# Patient Record
Sex: Male | Born: 1955 | Race: White | Hispanic: No | Marital: Married | Smoking: Former smoker
Health system: Southern US, Community
[De-identification: ages and names within clinical notes are randomized; demographics above are authoritative.]

## PROBLEM LIST (undated history)

## (undated) DIAGNOSIS — K219 Gastro-esophageal reflux disease without esophagitis: Secondary | ICD-10-CM

## (undated) DIAGNOSIS — I10 Essential (primary) hypertension: Secondary | ICD-10-CM

## (undated) DIAGNOSIS — E785 Hyperlipidemia, unspecified: Secondary | ICD-10-CM

## (undated) DIAGNOSIS — E119 Type 2 diabetes mellitus without complications: Secondary | ICD-10-CM

## (undated) DIAGNOSIS — E669 Obesity, unspecified: Secondary | ICD-10-CM

## (undated) DIAGNOSIS — C9500 Acute leukemia of unspecified cell type not having achieved remission: Secondary | ICD-10-CM

## (undated) HISTORY — PX: ADENOIDECTOMY: SHX5191

## (undated) HISTORY — DX: Hyperlipidemia, unspecified: E78.5

## (undated) HISTORY — PX: CORONARY ANGIOPLASTY WITH STENT PLACEMENT: SHX49

## (undated) HISTORY — DX: Obesity, unspecified: E66.9

---

## 2022-01-04 ENCOUNTER — Encounter (HOSPITAL_BASED_OUTPATIENT_CLINIC_OR_DEPARTMENT_OTHER): Payer: Self-pay | Admitting: Emergency Medicine

## 2022-01-04 ENCOUNTER — Emergency Department (HOSPITAL_BASED_OUTPATIENT_CLINIC_OR_DEPARTMENT_OTHER)
Admission: EM | Admit: 2022-01-04 | Discharge: 2022-01-04 | Disposition: A | Discharge status: Home / Self Care | Payer: 59 | Attending: Emergency Medicine | Admitting: Emergency Medicine

## 2022-01-04 ENCOUNTER — Emergency Department (HOSPITAL_BASED_OUTPATIENT_CLINIC_OR_DEPARTMENT_OTHER): Payer: 59

## 2022-01-04 ENCOUNTER — Other Ambulatory Visit: Payer: Self-pay

## 2022-01-04 DIAGNOSIS — R079 Chest pain, unspecified: Secondary | ICD-10-CM | POA: Diagnosis present

## 2022-01-04 DIAGNOSIS — I498 Other specified cardiac arrhythmias: Secondary | ICD-10-CM

## 2022-01-04 DIAGNOSIS — E119 Type 2 diabetes mellitus without complications: Secondary | ICD-10-CM | POA: Diagnosis not present

## 2022-01-04 DIAGNOSIS — I10 Essential (primary) hypertension: Secondary | ICD-10-CM | POA: Diagnosis not present

## 2022-01-04 DIAGNOSIS — R008 Other abnormalities of heart beat: Secondary | ICD-10-CM | POA: Diagnosis not present

## 2022-01-04 HISTORY — DX: Acute leukemia of unspecified cell type not having achieved remission: C95.00

## 2022-01-04 HISTORY — DX: Gastro-esophageal reflux disease without esophagitis: K21.9

## 2022-01-04 HISTORY — DX: Type 2 diabetes mellitus without complications: E11.9

## 2022-01-04 HISTORY — DX: Essential (primary) hypertension: I10

## 2022-01-04 LAB — BASIC METABOLIC PANEL
Anion gap: 6 (ref 5–15)
BUN: 23 mg/dL (ref 8–23)
CO2: 27 mmol/L (ref 22–32)
Calcium: 9.2 mg/dL (ref 8.9–10.3)
Chloride: 105 mmol/L (ref 98–111)
Creatinine, Ser: 1.54 mg/dL — ABNORMAL HIGH (ref 0.61–1.24)
GFR, Estimated: 49 mL/min — ABNORMAL LOW (ref >60)
Glucose, Bld: 164 mg/dL — ABNORMAL HIGH (ref 70–99)
Potassium: 3.9 mmol/L (ref 3.5–5.1)
Sodium: 138 mmol/L (ref 135–145)

## 2022-01-04 LAB — CBC
HCT: 48.8 % (ref 39.0–52.0)
Hemoglobin: 16.7 g/dL (ref 13.0–17.0)
MCH: 31.8 pg (ref 26.0–34.0)
MCHC: 34.2 g/dL (ref 30.0–36.0)
MCV: 93 fL (ref 80.0–100.0)
Platelets: 146 10*3/uL — ABNORMAL LOW (ref 150–400)
RBC: 5.25 MIL/uL (ref 4.22–5.81)
RDW: 12.4 % (ref 11.5–15.5)
WBC: 7.2 10*3/uL (ref 4.0–10.5)
nRBC: 0 % (ref 0.0–0.2)

## 2022-01-04 LAB — TROPONIN I (HIGH SENSITIVITY): Troponin I (High Sensitivity): 4 ng/L (ref <18)

## 2022-01-04 NOTE — ED Triage Notes (Signed)
Chest pain with walking x 3 months. Denies any SOB. denies any sob, denies any pain when sitting. Pt states he is going to [place] in a few weeks and wants to be evaluated. PCP and cardiologist advised he come to ER.  ?

## 2022-01-04 NOTE — Discharge Instructions (Addendum)
Please return to emergency department immediately for any worsening concerning signs or symptoms. ? ?Please get stress test and follow-up with the provided cardiologist for chest pain and bigeminy found during today's visit. ? ? ?

## 2022-01-04 NOTE — ED Provider Notes (Signed)
?[facility] ?Provider Note ? ? ?CSN: [id number] ?Arrival date & time: 01/04/22  1521 ? ?  ? ?History ? ?Chief Complaint  ?Patient presents with  ? Chest Pain  ? ? ?Koven Belinsky is a 66 y.o. male. ? ?Patient is a 66 year old male with past medical history of hypertension, diabetes, and previous coronary angiography presenting for complaints of chest pain.  Patient admits to intermittent chest pain, only occurring with exertion-specifically when walking uphill, described as pressure-like, resolving at rest, x3 months.  Denies any shortness of breath, lower extremity swelling, orthopnea, fevers, cough, or any other related symptoms. ? ?Previous cardiologist Dr. Sherrian Divers, last seen in 2019 ?Requesting cardiologist Dr. Julianne Handler (wife's mother uses and adores) ? ? ?The history is provided by the patient. No language interpreter was used.  ?Chest Pain ?Associated symptoms: no abdominal pain, no back pain, no cough, no fever, no palpitations, no shortness of breath and no vomiting   ? ?HPI: A 66 year old patient with a history of treated diabetes and hypertension presents for evaluation of chest pain. Initial onset of pain was more than 6 hours ago. The patient's chest pain is described as heaviness/pressure/tightness and is not worse with exertion. The patient's chest pain is not middle- or left-sided, is not well-localized, is not sharp and does not radiate to the arms/jaw/neck. The patient does not complain of nausea and denies diaphoresis. The patient has no history of stroke, has no history of peripheral artery disease, has not smoked in the past 90 days, has no relevant family history of coronary artery disease (first degree relative at less than age 33), has no history of hypercholesterolemia and does not have an elevated BMI (>=30).  ? ?Home Medications ?Prior to Admission medications   ?Not on File  ?   ? ?Allergies    ?Iodine   ? ?Review of Systems   ?Review of Systems  ?Constitutional:   Negative for chills and fever.  ?HENT:  Negative for ear pain and sore throat.   ?Eyes:  Negative for pain and visual disturbance.  ?Respiratory:  Negative for cough and shortness of breath.   ?Cardiovascular:  Positive for chest pain. Negative for palpitations.  ?Gastrointestinal:  Negative for abdominal pain and vomiting.  ?Genitourinary:  Negative for dysuria and hematuria.  ?Musculoskeletal:  Negative for arthralgias and back pain.  ?Skin:  Negative for color change and rash.  ?Neurological:  Negative for seizures and syncope.  ?All other systems reviewed and are negative. ? ?Physical Exam ?Updated Vital Signs ?BP 117/60   Pulse (!) 38   Temp 98.4 ?F (36.9 ?C) (Oral)   Resp 19   Wt 115.7 kg   SpO2 95%  ?Physical Exam ?Vitals and nursing note reviewed.  ?Constitutional:   ?   General: He is not in acute distress. ?   Appearance: He is well-developed.  ?HENT:  ?   Head: Normocephalic and atraumatic.  ?Eyes:  ?   Conjunctiva/sclera: Conjunctivae normal.  ?Cardiovascular:  ?   Rate and Rhythm: Normal rate and regular rhythm.  ?   Heart sounds: No murmur heard. ?Pulmonary:  ?   Effort: Pulmonary effort is normal. No respiratory distress.  ?   Breath sounds: Normal breath sounds.  ?Abdominal:  ?   Palpations: Abdomen is soft.  ?   Tenderness: There is no abdominal tenderness.  ?Musculoskeletal:     ?   General: No swelling.  ?   Cervical back: Neck supple.  ?Skin: ?   General: Skin  is warm and dry.  ?   Capillary Refill: Capillary refill takes less than 2 seconds.  ?Neurological:  ?   Mental Status: He is alert.  ?Psychiatric:     ?   Mood and Affect: Mood normal.  ? ? ?ED Results / Procedures / Treatments   ?Labs ?(all labs ordered are listed, but only abnormal results are displayed) ?Labs Reviewed  ?CBC - Abnormal; Notable for the following components:  ?    Result Value  ? Platelets 146 (*)   ? All other components within normal limits  ?BASIC METABOLIC PANEL - Abnormal; Notable for the following components:   ? Glucose, Bld 164 (*)   ? Creatinine, Ser 1.54 (*)   ? GFR, Estimated 49 (*)   ? All other components within normal limits  ?TROPONIN I (HIGH SENSITIVITY)  ?TROPONIN I (HIGH SENSITIVITY)  ? ? ?EKG ?EKG Interpretation ? ?Date/Time:  Monday Jan 04 2022 15:31:11 EDT ?Ventricular Rate:  65 ?PR Interval:  164 ?QRS Duration: 96 ?QT Interval:  394 ?QTC Calculation: 409 ?R Axis:   26 ?Text Interpretation: Normal sinus rhythm Normal ECG No previous ECGs available Confirmed by Campbell Stall ([id number]) on 02/04/5915 3:52:32 PM ? ?Radiology ?DG Chest 2 View ? ?Result Date: 01/04/2022 ?CLINICAL DATA:  Chest pain EXAM: CHEST - 2 VIEW COMPARISON:  09/13/2013 FINDINGS: The heart size and mediastinal contours are within normal limits. Both lungs are clear. The visualized skeletal structures are unremarkable. IMPRESSION: No active cardiopulmonary disease. Electronically Signed   By: Davina Poke D.O.   On: 01/04/2022 15:55   ? ?Procedures ?Procedures  ? ? ?Medications Ordered in ED ?Medications - No data to display ? ?ED Course/ Medical Decision Making/ A&P ?  ?HEAR Score: 5                       ?Medical Decision Making ?Amount and/or Complexity of Data Reviewed ?Labs: ordered. ?Radiology: ordered. ? ? ?17:61 PM ?66 year old male with past medical history of hypertension, diabetes, and previous coronary angiography 2017 presenting for complaints of chest pain.  ? ?The patient's chest pain is not suggestive of pulmonary embolus, cardiac ischemia, aortic dissection, pericarditis, myocarditis, pulmonary embolism, pneumothorax, pneumonia, Zoster, or esophageal perforation, or other serious etiology.  Historically not abrupt in onset, tearing or ripping, pulses symmetric. EKG nonspecific for ischemia/infarction. No dysrhythmias, brugada, WPW, prolonged QT noted. Troponin negative x2. CXR reviewed and stable. Labs without demonstration of acute pathology unless otherwise noted above.  ? ?Low HEART Score: 5.  At this time patient describes  chest pain that has happened intermittently with walking up a hill only.  No chest pain at rest.  No active chest pain.  At this time I think it is fair to control outpatient stress test for patient with most cardiology follow-up.  Patient agreeable to plan. ? ? Patient in no distress and overall condition improved here in the ED. Detailed discussions were had with the patient regarding current findings, and need for close f/u with PCP or on call doctor. The patient has been instructed to return immediately if the symptoms worsen in any way for re-evaluation. Patient verbalized understanding and is in agreement with current care plan. All questions answered prior to discharge.  ? ? ? ? ? ? ? ?Final Clinical Impression(s) / ED Diagnoses ?Final diagnoses:  ?Bigeminy  ?Chest pain, unspecified type  ? ? ?Rx / DC Orders ?ED Discharge Orders   ? ?      Ordered  ?  MYOCARDIAL PERFUSION IMAGING       ? 01/04/22 1835  ? ?  ?  ? ?  ? ? ?  ?Lianne Cure, DO ?[birth year] 1841 ? ?

## 2022-02-17 ENCOUNTER — Telehealth: Payer: Self-pay | Admitting: Cardiovascular Disease

## 2022-02-17 NOTE — Telephone Encounter (Signed)
Pt called to inform Dr. Angelena Form before his first consultation with him that he was in [place] last week and had a stent repair and angioplasty there. He stated that he does have his medical records.

## 2022-02-17 NOTE — Telephone Encounter (Signed)
Pt will bring records to his appt for Dr. Angelena Form. States he wanted McAlhany because he knew him somewhat, MD used to take care of his mother-in-law Noberto Retort. He appreciates the call. Aware I will forward to MD for his FYI.

## 2022-02-24 ENCOUNTER — Encounter: Payer: Self-pay | Admitting: Cardiovascular Disease

## 2022-02-24 ENCOUNTER — Ambulatory Visit (INDEPENDENT_AMBULATORY_CARE_PROVIDER_SITE_OTHER): Payer: 59 | Admitting: Cardiovascular Disease

## 2022-02-24 VITALS — BP 142/70 | HR 65 | Ht 72.0 in | Wt 253.0 lb

## 2022-02-24 DIAGNOSIS — E7800 Pure hypercholesterolemia, unspecified: Secondary | ICD-10-CM

## 2022-02-24 DIAGNOSIS — I2510 Atherosclerotic heart disease of native coronary artery without angina pectoris: Secondary | ICD-10-CM

## 2022-02-24 DIAGNOSIS — I10 Essential (primary) hypertension: Secondary | ICD-10-CM | POA: Diagnosis not present

## 2022-02-24 MED ORDER — CLOPIDOGREL BISULFATE 75 MG PO TABS: 75.0000 mg | ORAL_TABLET | Freq: Every day | ORAL | 3 refills | Status: DC

## 2022-02-24 NOTE — Patient Instructions (Signed)
Medication Instructions:  Your physician recommends that you continue on your current medications as directed. Please refer to the Current Medication list given to you today.  *If you need a refill on your cardiac medications before your next appointment, please call your pharmacy*   Testing/Procedures: Your physician has requested that you have an echocardiogram. Echocardiography is a painless test that uses sound waves to create images of your heart. It provides your doctor with information about the size and shape of your heart and how well your heart's chambers and valves are working. This procedure takes approximately one hour. There are no restrictions for this procedure.    Follow-Up: At [facility], you and your health needs are our priority.  As part of our continuing mission to provide you with exceptional heart care, we have created designated Provider Care Teams.  These Care Teams include your primary Cardiologist (physician) and Advanced Practice Providers (APPs -  Physician Assistants and Nurse Practitioners) who all work together to provide you with the care you need, when you need it.   Your next appointment:   6 month(s)  The format for your next appointment:   In Person  Provider:   Dr. Angelena Form     Important Information About Sugar

## 2022-02-24 NOTE — Progress Notes (Signed)
Chief Complaint  Patient presents with   New Patient (Initial Visit)    CAD   History of Present Illness:66 yo male with history of DM, HLD, HTN, obesity, leukemia ([place]), GERD and CAD who is here today as a new patient. He had a Resolute drug eluting stent placed in his Obtuse marginal branch in 2015 in [place]. He had an MI in April 2023 and had stent thrombosis of the OM stent treated with cutting balloon angioplasty and repeat stenting with a Xience DES. Also noted to have moderate proximal and mid LAD stenosis. No record of his LVEF. He has been feeling well. No chest pain, dyspnea, LE edema.   Primary Care Physician: [facility]   Past Medical History:  Diagnosis Date   Diabetes mellitus without complication ([facility])    GERD (gastroesophageal reflux disease)    Hyperlipidemia    Hypertension    Leukemia, acute ([place])    Obesity     Past Surgical History:  Procedure Laterality Date   ADENOIDECTOMY     CORONARY ANGIOPLASTY WITH STENT PLACEMENT      Current Outpatient Medications  Medication Sig Dispense Refill   aspirin EC 81 MG tablet Take 81 mg by mouth daily.     atenolol (TENORMIN) 50 MG tablet Take 50 mg by mouth daily.     dapagliflozin propanediol (FARXIGA) 10 MG TABS tablet Take 1 tablet by mouth daily.     glipiZIDE (GLUCOTROL XL) 5 MG 24 hr tablet Take 5 mg by mouth daily with breakfast.     lisinopril (ZESTRIL) 20 MG tablet Take 20 mg by mouth daily.     pantoprazole (PROTONIX) 40 MG tablet Take 40 mg by mouth daily.     pioglitazone-metformin (ACTOPLUS MET) 15-850 MG tablet Take 1 tablet by mouth in the morning and at bedtime.     pravastatin (PRAVACHOL) 40 MG tablet Take 40 mg by mouth daily.     clopidogrel (PLAVIX) 75 MG tablet Take 1 tablet (75 mg total) by mouth daily. 90 tablet 3   No current facility-administered medications for this visit.    Allergies  Allergen Reactions   Iodine Anaphylaxis   Ivp Dye [Iodinated Contrast Media]  Anaphylaxis    Social History   Socioeconomic History   Marital status: Married    Spouse name: Not on file   Number of children: Not on file   Years of education: Not on file   Highest education level: Not on file  Occupational History   Not on file  Tobacco Use   Smoking status: Former    Packs/day: 1.00    Years: 15.00    Total pack years: 15.00    Types: Cigarettes   Smokeless tobacco: Never  Substance and Sexual Activity   Alcohol use: Not Currently   Drug use: Not Currently   Sexual activity: Not on file  Other Topics Concern   Not on file  Social History Narrative   Not on file   Social Determinants of Health   Financial Resource Strain: Not on file  Food Insecurity: Not on file  Transportation Needs: Not on file  Physical Activity: Not on file  Stress: Not on file  Social Connections: Not on file  Intimate Partner Violence: Not on file    Family History  Problem Relation Age of Onset   Heart disease Mother    Heart attack Father     Review of Systems:  As stated in the HPI and otherwise  negative.   BP (!) 142/70   Pulse 65   Ht 6' (1.829 m)   Wt 253 lb (114.8 kg)   SpO2 98%   BMI 34.31 kg/m   Physical Examination: General: Well developed, well nourished, NAD  HEENT: OP clear, mucus membranes moist  SKIN: warm, dry. No rashes. Neuro: No focal deficits  Musculoskeletal: Muscle strength 5/5 all ext  Psychiatric: Mood and affect normal  Neck: No JVD, no carotid bruits, no thyromegaly, no lymphadenopathy.  Lungs:Clear bilaterally, no wheezes, rhonci, crackles Cardiovascular: Regular rate and rhythm. No murmurs, gallops or rubs. Abdomen:Soft. Bowel sounds present. Non-tender.  Extremities: No lower extremity edema. Pulses are 2 + in the bilateral DP/PT.  EKG:  EKG is ordered today. The ekg ordered today demonstrates Sinus, PVC  Recent Labs: 01/04/2022: BUN 23; Creatinine, Ser 1.54; Hemoglobin 16.7; Platelets 146; Potassium 3.9; Sodium 138    Lipid Panel No results found for: "CHOL", "TRIG", "HDL", "CHOLHDL", "VLDL", "LDLCALC", "LDLDIRECT"   Wt Readings from Last 3 Encounters:  02/24/22 253 lb (114.8 kg)  01/04/22 255 lb (115.7 kg)     Assessment and Plan:   1. CAD without angina: No chest pain. Recent MI in [place] with placement of a DES in the OM branch. Moderate LAD stenosis treated medically. Will continue DAPT with ASA and Plavix for at least a year. Continue beta blocker and statin. Echo now to assess LVEF.   2. HTN: BP controlled. No changes.   3. Hyperlipidemia: Continue statin  Labs/ tests ordered today include:   Orders Placed This Encounter  Procedures   EKG 12-Lead   ECHOCARDIOGRAM COMPLETE   Disposition:   F/U with me in 6 months.    Signed, Lauree Chandler, MD 02/24/2022 9:48 AM    [facility] [address], [place], [place]  [address] Phone: [phone]; Fax: [phone]

## 2022-03-15 ENCOUNTER — Ambulatory Visit (HOSPITAL_COMMUNITY): Payer: 59 | Attending: Internal Medicine

## 2022-03-15 DIAGNOSIS — I10 Essential (primary) hypertension: Secondary | ICD-10-CM | POA: Diagnosis not present

## 2022-03-15 DIAGNOSIS — E7800 Pure hypercholesterolemia, unspecified: Secondary | ICD-10-CM | POA: Diagnosis not present

## 2022-03-15 DIAGNOSIS — I2510 Atherosclerotic heart disease of native coronary artery without angina pectoris: Secondary | ICD-10-CM | POA: Insufficient documentation

## 2022-03-15 LAB — ECHOCARDIOGRAM COMPLETE
Area-P 1/2: 3.6 cm2
S' Lateral: 3.4 cm

## 2022-05-10 ENCOUNTER — Encounter: Payer: Self-pay | Admitting: Nurse Practitioner

## 2022-05-10 ENCOUNTER — Ambulatory Visit: Payer: 59 | Attending: Nurse Practitioner | Admitting: Nurse Practitioner

## 2022-05-10 ENCOUNTER — Telehealth: Payer: Self-pay | Admitting: Cardiovascular Disease

## 2022-05-10 ENCOUNTER — Ambulatory Visit (INDEPENDENT_AMBULATORY_CARE_PROVIDER_SITE_OTHER): Payer: 59

## 2022-05-10 VITALS — BP 128/84 | HR 56 | Ht 72.0 in | Wt 256.0 lb

## 2022-05-10 DIAGNOSIS — I10 Essential (primary) hypertension: Secondary | ICD-10-CM | POA: Diagnosis not present

## 2022-05-10 DIAGNOSIS — I498 Other specified cardiac arrhythmias: Secondary | ICD-10-CM

## 2022-05-10 DIAGNOSIS — R001 Bradycardia, unspecified: Secondary | ICD-10-CM

## 2022-05-10 DIAGNOSIS — R002 Palpitations: Secondary | ICD-10-CM

## 2022-05-10 DIAGNOSIS — I2510 Atherosclerotic heart disease of native coronary artery without angina pectoris: Secondary | ICD-10-CM | POA: Diagnosis not present

## 2022-05-10 DIAGNOSIS — E785 Hyperlipidemia, unspecified: Secondary | ICD-10-CM

## 2022-05-10 NOTE — Progress Notes (Unsigned)
Enrolled for [employer] to mail a ZIO XT long term holter monitor to the patients address on file.   Dr. McAlhany to read. 

## 2022-05-10 NOTE — Telephone Encounter (Signed)
Call sent straight to triage. Patient complaining of low heart rate and palpitations. Patent stated he went to his oncologist last Friday and they were concerned about his low HR and did an EKG that was showing some possible bigeminy. Patient's oncologist advised that patient sees cardiology. Made patient an appointment with Christen Bame PA to get evaluated. Patient will bring notes and EKG from oncologist office with him.

## 2022-05-10 NOTE — Patient Instructions (Signed)
Medication Instructions:   Your physician recommends that you continue on your current medications as directed. Please refer to the Current Medication list given to you today.   *If you need a refill on your cardiac medications before your next appointment, please call your pharmacy*   Lab Work:  None ordered.  If you have labs (blood work) drawn today and your tests are completely normal, you will receive your results only by: [place] (if you have MyChart) OR A paper copy in the mail If you have any lab test that is abnormal or we need to change your treatment, we will call you to review the results.   Testing/Procedures:  [facility] Gulling- Long Term Monitor Instructions  Your physician has requested you wear a ZIO patch monitor for 14 days.  This is a single patch monitor. [employer] supplies one patch monitor per enrollment. Additional stickers are not available. Please do not apply patch if you will be having a Nuclear Stress Test,  Echocardiogram, Cardiac CT, MRI, or Chest Xray during the period you would be wearing the  monitor. The patch cannot be worn during these tests. You cannot remove and re-apply the  ZIO XT patch monitor.  Your ZIO patch monitor will be mailed 3 day USPS to your address on file. It may take 3-5 days  to receive your monitor after you have been enrolled.  Once you have received your monitor, please review the enclosed instructions. Your monitor  has already been registered assigning a specific monitor serial # to you.  Billing and Patient Assistance Program Information  We have supplied [employer] with any of your insurance information on file for billing purposes. [employer] offers a sliding scale Patient Assistance Program for patients that do not have  insurance, or whose insurance does not completely cover the cost of the ZIO monitor.  You must apply for the Patient Assistance Program to qualify for this discounted rate.  To apply, please call [employer] at  [phone], select option 4, select option 2, ask to apply for  Patient Assistance Program. Theodore Demark will ask your household income, and how many people  are in your household. They will quote your out-of-pocket cost based on that information.  [employer] will also be able to set up a 62-month, interest-free payment plan if needed.  Applying the monitor   Shave hair from upper left chest.  Hold abrader disc by orange tab. Rub abrader in 40 strokes over the upper left chest as  indicated in your monitor instructions.  Clean area with 4 enclosed alcohol pads. Let dry.  Apply patch as indicated in monitor instructions. Patch will be placed under collarbone on left  side of chest with arrow pointing upward.  Rub patch adhesive wings for 2 minutes. Remove white label marked "1". Remove the white  label marked "2". Rub patch adhesive wings for 2 additional minutes.  While looking in a mirror, press and release button in center of patch. A small green light will  flash 3-4 times. This will be your only indicator that the monitor has been turned on.  Do not shower for the first 24 hours. You may shower after the first 24 hours.  Press the button if you feel a symptom. You will hear a small click. Record Date, Time and  Symptom in the Patient Logbook.  When you are ready to remove the patch, follow instructions on the last 2 pages of Patient  Logbook. Stick patch monitor onto the last page of Patient  Logbook.  Place Patient Logbook in the blue and white box. Use locking tab on box and tape box closed  securely. The blue and white box has prepaid postage on it. Please place it in the mailbox as  soon as possible. Your physician should have your test results approximately 7 days after the  monitor has been mailed back to [facility].  Call [place] at [phone] if you have questions regarding  your ZIO XT patch monitor. Call them immediately if you see an orange light  blinking on your  monitor.  If your monitor falls off in less than 4 days, contact our Monitor department at [phone].  If your monitor becomes loose or falls off after 4 days call [employer] at [phone] for  suggestions on securing your monitor    Follow-Up: At [facility], you and your health needs are our priority.  As part of our continuing mission to provide you with exceptional heart care, we have created designated Provider Care Teams.  These Care Teams include your primary Cardiologist (physician) and Advanced Practice Providers (APPs -  Physician Assistants and Nurse Practitioners) who all work together to provide you with the care you need, when you need it.  We recommend signing up for the patient portal called "MyChart".  Sign up information is provided on this After Visit Summary.  MyChart is used to connect with patients for Virtual Visits (Telemedicine).  Patients are able to view lab/test results, encounter notes, upcoming appointments, etc.  Non-urgent messages can be sent to your provider as well.   To learn more about what you can do with MyChart, go to [email or web address].    Your next appointment:   3 month(s)  The format for your next appointment:   In Person  Provider:   Lauree Chandler, MD      Important Information About Sugar

## 2022-05-10 NOTE — Progress Notes (Signed)
Cardiology Office Note:    Date:  05/10/2022   ID:  Steven Cannon, DOB [birth year], MRN [id number]  PCP:  [facility], [place] Providers Cardiologist:  Lauree Chandler, MD      Referring MD: [facility], [place] [facility]*   Chief Complaint: bradycardia   History of Present Illness:    Steven Cannon is a very pleasant 66 y.o. male with a hx of acute promyelocytic leukemia, HTN, HLD, CAD with a history of DES in obtuse marginal branch in 2015 in [place]. Had MI April 2023 with stent thrombosis of OM stent treated with Cutting Balloon angioplasty and repeat stenting with DES.  Noted to have moderate proximal and mid LAD stenosis. LVEF not mentioned.  He established care with Steven Cannon on 02/24/2022 at which time he reported no chest pain. On DAPT with aspirin and Plavix. Outside cath films were reviewed by Steven Cannon.  Echocardiogram was ordered which revealed normal LVEF, trivial MR.   Today, he is here for evaluation of low heart rate. He contacted our office this morning with concerns of HR in the 30s at rest and palpitations. Reports he is noting HR as low as 30's bpm when resting. Walked at moderate to high intensity with 4 inclines for 3/4 mile over the weekend and HR increased to 60s bpm. He denies chest pain, shortness of breath, lower extremity edema, fatigue, palpitations, melena, hematuria, hemoptysis, diaphoresis, weakness, presyncope, syncope, orthopnea, and PND. Was told he had trigeminy during hospitalization in [place] prior to cardiac catheterization. Having burning in chest that occurs after drinking seltzers. Switched from pantoprazole to Nexium. Has appointment with PCP in 4 days to address.   Past Medical History:  Diagnosis Date   Diabetes mellitus without complication (HCC)    GERD (gastroesophageal reflux disease)    Hyperlipidemia    Hypertension    Leukemia, acute ([place])    Obesity     Past Surgical History:  Procedure  Laterality Date   ADENOIDECTOMY     CORONARY ANGIOPLASTY WITH STENT PLACEMENT      Current Medications: Current Meds  Medication Sig   aspirin EC 81 MG tablet Take 81 mg by mouth daily.   atenolol (TENORMIN) 50 MG tablet Take 50 mg by mouth daily.   clopidogrel (PLAVIX) 75 MG tablet Take 1 tablet (75 mg total) by mouth daily.   dapagliflozin propanediol (FARXIGA) 10 MG TABS tablet Take 1 tablet by mouth daily.   esomeprazole (NEXIUM) 20 MG capsule Take 20 mg by mouth daily.   glipiZIDE (GLUCOTROL XL) 5 MG 24 hr tablet Take 5 mg by mouth daily with breakfast.   lisinopril (ZESTRIL) 20 MG tablet Take 20 mg by mouth daily.   pioglitazone-metformin (ACTOPLUS MET) 15-850 MG tablet Take 1 tablet by mouth in the morning and at bedtime.   pravastatin (PRAVACHOL) 40 MG tablet Take 40 mg by mouth daily.     Allergies:   Iodine and Ivp dye [iodinated contrast media]   Social History   Socioeconomic History   Marital status: Married    Spouse name: Not on file   Number of children: Not on file   Years of education: Not on file   Highest education level: Not on file  Occupational History   Not on file  Tobacco Use   Smoking status: Former    Packs/day: 1.00    Years: 15.00    Total pack years: 15.00    Types: Cigarettes   Smokeless  tobacco: Never  Substance and Sexual Activity   Alcohol use: Not Currently   Drug use: Not Currently   Sexual activity: Not on file  Other Topics Concern   Not on file  Social History Narrative   Not on file   Social Determinants of Health   Financial Resource Strain: Not on file  Food Insecurity: Not on file  Transportation Needs: Not on file  Physical Activity: Not on file  Stress: Not on file  Social Connections: Not on file     Family History: The patient's family history includes Heart attack in his father; Heart disease in his mother.  ROS:   Please see the history of present illness.   + frequent PVCs All other systems reviewed and  are negative.  Labs/Other Studies Reviewed:    The following studies were reviewed today:  Echo 03/15/22   1. Left ventricular ejection fraction, by estimation, is 65 to 70%. The  left ventricle has normal function. The left ventricle has no regional  wall motion abnormalities. The left ventricular internal cavity size was  mildly dilated. Left ventricular  diastolic parameters were normal.   2. Right ventricular systolic function is normal. The right ventricular  size is normal.   3. Left atrial size was mildly dilated.   4. The mitral valve is normal in structure. Trivial mitral valve  regurgitation.   5. The aortic valve is normal in structure. Aortic valve regurgitation is  not visualized.   6. The inferior vena cava is normal in size with greater than 50%  respiratory variability, suggesting right atrial pressure of 3 mmHg.   Recent Labs: 01/04/2022: BUN 23; Creatinine, Ser 1.54; Hemoglobin 16.7; Platelets 146; Potassium 3.9; Sodium 138  Recent Lipid Panel No results found for: "CHOL", "TRIG", "HDL", "CHOLHDL", "VLDL", "LDLCALC", "LDLDIRECT"   Risk Assessment/Calculations:      Physical Exam:    VS:  BP 128/84   Pulse (!) 56   Ht 6' (1.829 m)   Wt 256 lb (116.1 kg)   BMI 34.72 kg/m     Wt Readings from Last 3 Encounters:  05/10/22 256 lb (116.1 kg)  02/24/22 253 lb (114.8 kg)  01/04/22 255 lb (115.7 kg)     GEN:  Well nourished, well developed in no acute distress HEENT: Normal NECK: No JVD; No carotid bruits CARDIAC: RRR, no murmurs, rubs, gallops RESPIRATORY:  Clear to auscultation without rales, wheezing or rhonchi  ABDOMEN: Soft, non-tender, obese MUSCULOSKELETAL:  No edema; No deformity. 2+ pedal pulses, equal bilaterally SKIN: Warm and dry NEUROLOGIC:  Alert and oriented x 3 PSYCHIATRIC:  Normal affect   EKG:  EKG is not ordered today.  He brought a copy of EKG completed [facility]/07/20[facility] at [facility] which reveals sinus rhythm at 66 bpm with  bigeminy   Diagnoses:    1. Coronary artery disease involving native coronary artery of native heart without angina pectoris   2. Bradycardia   3. Primary hypertension   4. Hyperlipidemia LDL goal <70   5. Palpitations   6. Ventricular bigeminy    Assessment and Plan:     Palpitations/Ventricular bigeminy: Recording low HR on home equipment. [facility] from [facility] on 9/8 reveals ventricular bigeminy. Suspect that home monitor is not picking up frequent PVCs.  We will place a 14-day Zio monitor for evaluation of arrhythmia. Has been on atenolol for many years. Would favor metoprolol in the place of atenolol but will await monitor results for dosing.  CAD without angina:  History of DES to obtuse marginal artery 2015 in [facility]. MI 11/2021 in [place] with cath that revealed ISR treated with balloon angioplasty and DES. He denies chest pain, dyspnea, or other symptoms concerning for angina. No indication for further ischemic evaluation at this time. No bleeding concerns. Continue atenolol, pravastatin, lisinopril, Farxiga, Plavix, and aspirin.   Hypertension: BP is well-controlled.   Hyperlipidemia LDL goal < 70: LDL 101 on 01/01/22. Not specifically addressed today. Would favor high intensity statin in the place of pravastatin.      Disposition: 3 months with Steven Cannon  Medication Adjustments/Labs and Tests Ordered: Current medicines are reviewed at length with the patient today.  Concerns regarding medicines are outlined above.  Orders Placed This Encounter  Procedures   LONG TERM MONITOR (3-14 DAYS)   No orders of the defined types were placed in this encounter.   Patient Instructions  Medication Instructions:   Your physician recommends that you continue on your current medications as directed. Please refer to the Current Medication list given to you today.   *If you need a refill on your cardiac medications before your next appointment, please call your pharmacy*   Lab  Work:  None ordered.  If you have labs (blood work) drawn today and your tests are completely normal, you will receive your results only by: [place] (if you have MyChart) OR A paper copy in the mail If you have any lab test that is abnormal or we need to change your treatment, we will call you to review the results.   Testing/Procedures:  [facility]- Long Term Monitor Instructions  Your physician has requested you wear a ZIO patch monitor for 14 days.  This is a single patch monitor. [employer] supplies one patch monitor per enrollment. Additional stickers are not available. Please do not apply patch if you will be having a Nuclear Stress Test,  Echocardiogram, Cardiac CT, MRI, or Chest Xray during the period you would be wearing the  monitor. The patch cannot be worn during these tests. You cannot remove and re-apply the  ZIO XT patch monitor.  Your ZIO patch monitor will be mailed 3 day USPS to your address on file. It may take 3-5 days  to receive your monitor after you have been enrolled.  Once you have received your monitor, please review the enclosed instructions. Your monitor  has already been registered assigning a specific monitor serial # to you.  Billing and Patient Assistance Program Information  We have supplied [employer] with any of your insurance information on file for billing purposes. [employer] offers a sliding scale Patient Assistance Program for patients that do not have  insurance, or whose insurance does not completely cover the cost of the ZIO monitor.  You must apply for the Patient Assistance Program to qualify for this discounted rate.  To apply, please call [employer] at [phone], select option 4, select option 2, ask to apply for  Patient Assistance Program. Theodore Demark will ask your household income, and how many people  are in your household. They will quote your out-of-pocket cost based on that information.  [employer] will also be able to set up a 70-month  interest-free payment plan if needed.  Applying the monitor   Shave hair from upper left chest.  Hold abrader disc by orange tab. Rub abrader in 40 strokes over the upper left chest as  indicated in your monitor instructions.  Clean area with 4 enclosed alcohol pads. Let dry.  Apply patch as  indicated in monitor instructions. Patch will be placed under collarbone on left  side of chest with arrow pointing upward.  Rub patch adhesive wings for 2 minutes. Remove white label marked "1". Remove the white  label marked "2". Rub patch adhesive wings for 2 additional minutes.  While looking in a mirror, press and release button in center of patch. A small green light will  flash 3-4 times. This will be your only indicator that the monitor has been turned on.  Do not shower for the first 24 hours. You may shower after the first 24 hours.  Press the button if you feel a symptom. You will hear a small click. Record Date, Time and  Symptom in the Patient Logbook.  When you are ready to remove the patch, follow instructions on the last 2 pages of Patient  Logbook. Stick patch monitor onto the last page of Patient Logbook.  Place Patient Logbook in the blue and white box. Use locking tab on box and tape box closed  securely. The blue and white box has prepaid postage on it. Please place it in the mailbox as  soon as possible. Your physician should have your test results approximately 7 days after the  monitor has been mailed back to [facility].  Call [place] at [phone] if you have questions regarding  your ZIO XT patch monitor. Call them immediately if you see an orange light blinking on your  monitor.  If your monitor falls off in less than 4 days, contact our Monitor department at [phone].  If your monitor becomes loose or falls off after 4 days call [employer] at [phone] for  suggestions on securing your monitor    Follow-Up: At [facility],  you and your health needs are our priority.  As part of our continuing mission to provide you with exceptional heart care, we have created designated Provider Care Teams.  These Care Teams include your primary Cardiologist (physician) and Advanced Practice Providers (APPs -  Physician Assistants and Nurse Practitioners) who all work together to provide you with the care you need, when you need it.  We recommend signing up for the patient portal called "MyChart".  Sign up information is provided on this After Visit Summary.  MyChart is used to connect with patients for Virtual Visits (Telemedicine).  Patients are able to view lab/test results, encounter notes, upcoming appointments, etc.  Non-urgent messages can be sent to your provider as well.   To learn more about what you can do with MyChart, go to [email or web address].    Your next appointment:   3 month(s)  The format for your next appointment:   In Person  Provider:   Lauree Chandler, MD      Important Information About Sugar         Signed, Emmaline Life, NP  05/10/2022 12:59 PM    [place]

## 2022-05-10 NOTE — Telephone Encounter (Signed)
STAT if HR is under 50 or over 120 (normal HR is 60-100 beats per minute)  What is your heart rate? 30  Do you have a log of your heart rate readings (document readings)? Yes   Do you have any other symptoms? Chest spasms, palpitations

## 2022-05-23 DIAGNOSIS — I10 Essential (primary) hypertension: Secondary | ICD-10-CM | POA: Diagnosis not present

## 2022-05-23 DIAGNOSIS — R001 Bradycardia, unspecified: Secondary | ICD-10-CM

## 2022-06-15 ENCOUNTER — Telehealth: Payer: Self-pay | Admitting: Nurse Practitioner

## 2022-06-15 DIAGNOSIS — I493 Ventricular premature depolarization: Secondary | ICD-10-CM

## 2022-06-15 NOTE — Telephone Encounter (Addendum)
Spoke w patient re: monitor results. Referral placed for EP for freq PVCs.  When I talked w him about changing atenolol 50 mg to Toprol XL 50 mg he told me he takes atenolol 50 mg twice a day.  He said Caswell Corwin at his PCP office adjusted it.   Pt aware I will clarify with Dr. Angelena Form if he still would use Toprol XL 50 mg daily based on pt's report of atenolol dose/freq.  The patient knows he will be called to schedule and wanted to make sure we know that he put the monitor on the right side of his chest by mistake.  He called the company and after 8 days he received a call back instructing him to take the monitor off and they would send him a new one.  He took it off and mailed it back and these are the results.   Never received a new one.

## 2022-06-15 NOTE — Telephone Encounter (Addendum)
-----   Message from Imogene Burn, PA-C sent at 06/11/2022  7:13 AM EDT ----- Covering for Christen Bame. Monitor shows NSR -no HR's in the 30's but frequent PVC's 16% of time. Please refer to EPS for further evaluation. Thanks    -------Burnell Blanks, MD  Swinyer, Lanice Schwab, NP; Rodman Key, RN See cardiac monitor. Lowest heart rate is 46 bpm. Frequent PVCs. I agree that we can change his atenolol to Toprol XL 50 mg daily if he is willing. Gerald Stabs

## 2022-06-15 NOTE — Telephone Encounter (Signed)
Patient returned call for his monitor results.

## 2022-06-16 MED ORDER — METOPROLOL SUCCINATE ER 100 MG PO TB24: 100.0000 mg | ORAL_TABLET | Freq: Every day | ORAL | 3 refills | Status: DC

## 2022-06-16 NOTE — Telephone Encounter (Signed)
Burnell Blanks, MD  Rodman Key, RN      Let's have him take Toprol 100 mg daily. [facility] and left VM for patient clarifying dose of Toprol XL is 100 mg daily and that he needs to stop atenolol and start daily Toprol XL 100 mg

## 2022-07-14 ENCOUNTER — Ambulatory Visit: Payer: 59 | Attending: Cardiology | Admitting: Cardiology

## 2022-07-14 ENCOUNTER — Encounter: Payer: Self-pay | Admitting: Cardiology

## 2022-07-14 VITALS — BP 126/74 | HR 58 | Ht 72.0 in | Wt 265.2 lb

## 2022-07-14 DIAGNOSIS — I2510 Atherosclerotic heart disease of native coronary artery without angina pectoris: Secondary | ICD-10-CM

## 2022-07-14 DIAGNOSIS — I493 Ventricular premature depolarization: Secondary | ICD-10-CM | POA: Diagnosis not present

## 2022-07-14 NOTE — Patient Instructions (Signed)
Medication Instructions:  Your physician recommends that you continue on your current medications as directed. Please refer to the Current Medication list given to you today.  *If you need a refill on your cardiac medications before your next appointment, please call your pharmacy*   Lab Work: None ordered   Testing/Procedures: None ordered   Follow-Up: At [facility], you and your health needs are our priority.  As part of our continuing mission to provide you with exceptional heart care, we have created designated Provider Care Teams.  These Care Teams include your primary Cardiologist (physician) and Advanced Practice Providers (APPs -  Physician Assistants and Nurse Practitioners) who all work together to provide you with the care you need, when you need it.  We recommend signing up for the patient portal called "MyChart".  Sign up information is provided on this After Visit Summary.  MyChart is used to connect with patients for Virtual Visits (Telemedicine).  Patients are able to view lab/test results, encounter notes, upcoming appointments, etc.  Non-urgent messages can be sent to your provider as well.   To learn more about what you can do with MyChart, go to [email or web address].    Your next appointment:   6 month(s)  The format for your next appointment:   In Person  Provider:   Allegra Lai, MD    Thank you for choosing [facility]!!   Trinidad Curet, RN [phone]  Other Instructions    Important Information About Sugar

## 2022-07-14 NOTE — Progress Notes (Signed)
Electrophysiology Office Note   Date:  07/14/2022   ID:  Steven Regal. Cannon, DOB 12-22-1955, MRN [id number]  PCP:  [facility]  Cardiologist:  Angelena Form Primary Electrophysiologist:  Patrice Moates Meredith Leeds, MD    Chief Complaint: PVC   History of Present Illness: Steven Cannon. Casteneda is a 66 y.o. male who is being seen today for the evaluation of PVC at the request of Steven Burn, PA-C. Presenting today for electrophysiology evaluation.  He has a history significant for acute promyelocytic leukemia, hypertension, hyperlipidemia, coronary artery disease post DES to the OM in 2015.  April 2023 presented with in-stent thrombosis of the OM stent treated with Cutting Balloon angioplasty and repeat stenting of the DES.    He presented to cardiology clinic with concerns of low heart rate.  He would noted heart rates in the 30s when resting.  When he walked, heart rates got into the 60s.  He had no chest pain, shortness of breath, edema, fatigue, palpitations.  He wore a cardiac monitor that showed a 16% PVC burden.  Today, he denies symptoms of palpitations, chest pain, shortness of breath, orthopnea, PND, lower extremity edema, claudication, dizziness, presyncope, syncope, bleeding, or neurologic sequela. The patient is tolerating medications without difficulties.    Past Medical History:  Diagnosis Date   Diabetes mellitus without complication (HCC)    GERD (gastroesophageal reflux disease)    Hyperlipidemia    Hypertension    Leukemia, acute (HCC)    Obesity    Past Surgical History:  Procedure Laterality Date   ADENOIDECTOMY     CORONARY ANGIOPLASTY WITH STENT PLACEMENT       Current Outpatient Medications  Medication Sig Dispense Refill   aspirin EC 81 MG tablet Take 81 mg by mouth daily.     clopidogrel (PLAVIX) 75 MG tablet Take 1 tablet (75 mg total) by mouth daily. 90 tablet 3   dapagliflozin propanediol (FARXIGA) 10 MG TABS tablet Take 1 tablet by mouth  daily.     esomeprazole (NEXIUM) 20 MG capsule Take 20 mg by mouth daily.     glipiZIDE (GLUCOTROL XL) 5 MG 24 hr tablet Take 5 mg by mouth daily with breakfast.     lisinopril (ZESTRIL) 20 MG tablet Take 20 mg by mouth daily.     metoprolol succinate (TOPROL-XL) 100 MG 24 hr tablet Take 1 tablet (100 mg total) by mouth daily. Take with or immediately following a meal. 90 tablet 3   pioglitazone-metformin (ACTOPLUS MET) 15-850 MG tablet Take 1 tablet by mouth in the morning and at bedtime.     pravastatin (PRAVACHOL) 80 MG tablet Take 1 tablet by mouth daily.     pravastatin (PRAVACHOL) 40 MG tablet Take 40 mg by mouth daily.     No current facility-administered medications for this visit.    Allergies:   Iodine and Ivp dye [iodinated contrast media]   Social History:  The patient  reports that he has quit smoking. His smoking use included cigarettes. He has a 15.00 pack-year smoking history. He has never used smokeless tobacco. He reports that he does not currently use alcohol. He reports that he does not currently use drugs.   Family History:  The patient's family history includes Heart attack in his father; Heart disease in his mother.    ROS:  Please see the history of present illness.   Otherwise, review of systems is positive for none.   All other systems are reviewed and negative.  PHYSICAL EXAM: VS:  BP 126/74   Pulse (!) 58   Ht 6' (1.829 m)   Wt 265 lb 3.2 oz (120.3 kg)   SpO2 96%   BMI 35.97 kg/m  , BMI Body mass index is 35.97 kg/m. GEN: Well nourished, well developed, in no acute distress  HEENT: normal  Neck: no JVD, carotid bruits, or masses Cardiac: RRR; no murmurs, rubs, or gallops,no edema  Respiratory:  clear to auscultation bilaterally, normal work of breathing GI: soft, nontender, nondistended, + BS MS: no deformity or atrophy  Skin: warm and dry Neuro:  Strength and sensation are intact Psych: euthymic mood, full affect  EKG:  EKG is ordered  today. Personal review of the ekg ordered shows sinus rhythm  Recent Labs: 01/04/2022: BUN 23; Creatinine, Ser 1.54; Hemoglobin 16.7; Platelets 146; Potassium 3.9; Sodium 138    Lipid Panel  No results found for: "CHOL", "TRIG", "HDL", "CHOLHDL", "VLDL", "LDLCALC", "LDLDIRECT"   Wt Readings from Last 3 Encounters:  07/14/22 265 lb 3.2 oz (120.3 kg)  05/10/22 256 lb (116.1 kg)  02/24/22 253 lb (114.8 kg)      Other studies Reviewed: Additional studies/ records that were reviewed today include: TTE 03/15/22  Review of the above records today demonstrates:   1. Left ventricular ejection fraction, by estimation, is 65 to 70%. The  left ventricle has normal function. The left ventricle has no regional  wall motion abnormalities. The left ventricular internal cavity size was  mildly dilated. Left ventricular  diastolic parameters were normal.   2. Right ventricular systolic function is normal. The right ventricular  size is normal.   3. Left atrial size was mildly dilated.   4. The mitral valve is normal in structure. Trivial mitral valve  regurgitation.   5. The aortic valve is normal in structure. Aortic valve regurgitation is  not visualized.   6. The inferior vena cava is normal in size with greater than 50%  respiratory variability, suggesting right atrial pressure of 3 mmHg.   Cardiac monitor 06/10/2022 personally reviewed Sinus rhythm. (min HR of 46 bpm, max HR of 95 bpm, and avg HR of 64 bpm).  Rare premature atrial contractions. (<1.0%).  Frequent premature ventricular contractions. (16.0%, 91232), VE Couplets were rare (<1.0%, 9), and VE Triplets were rare (<1.0%, 1). Ventricular Bigeminy and Trigeminy were present.   ASSESSMENT AND PLAN:  1.  PVCs: 16% burden on cardiac monitor.  Asymptomatic.  He is unaware of his PVCs.  He noted slow heart rates at his oncologist office, but has been able to do all of his daily activities.  He was started on metoprolol from atenolol  with improvement in his heart rates.  We Miloh Alcocer continue current management.  He Jennife Zaucha likely need echo next number to ensure his function has remained normal.  2.  Coronary artery disease: Status post DES to the OM with repeat balloon angioplasty April 2023.  No current chest pain.  Plan per primary cardiology.  3.  Hypertension: Currently well controlled  4.  Hyperlipidemia: Continue plan per primary cardiology    Current medicines are reviewed at length with the patient today.   The patient does not have concerns regarding his medicines.  The following changes were made today:  none  Labs/ tests ordered today include:  Orders Placed This Encounter  Procedures   EKG 12-Lead     Disposition:   FU with Darvis Croft 6 months  Signed, Gregoria Selvy Meredith Leeds, MD  07/14/2022 9:49 AM  [place] [place] [id number] [phone] (office) [phone] (fax)

## 2022-08-02 ENCOUNTER — Ambulatory Visit: Payer: 59 | Admitting: Cardiovascular Disease

## 2023-01-19 ENCOUNTER — Encounter: Payer: Self-pay | Admitting: Cardiology

## 2023-01-19 ENCOUNTER — Ambulatory Visit: Payer: 59 | Attending: Cardiology | Admitting: Cardiology

## 2023-01-19 VITALS — BP 130/82 | HR 70 | Ht 72.0 in | Wt 272.0 lb

## 2023-01-19 DIAGNOSIS — I10 Essential (primary) hypertension: Secondary | ICD-10-CM

## 2023-01-19 DIAGNOSIS — I493 Ventricular premature depolarization: Secondary | ICD-10-CM

## 2023-01-19 DIAGNOSIS — I2510 Atherosclerotic heart disease of native coronary artery without angina pectoris: Secondary | ICD-10-CM | POA: Diagnosis not present

## 2023-01-19 DIAGNOSIS — E785 Hyperlipidemia, unspecified: Secondary | ICD-10-CM | POA: Diagnosis not present

## 2023-01-19 NOTE — Patient Instructions (Addendum)
Medication Instructions:  Your physician recommends that you continue on your current medications as directed. Please refer to the Current Medication list given to you today.  *If you need a refill on your cardiac medications before your next appointment, please call your pharmacy*   Lab Work: None ordered If you have labs (blood work) drawn today and your tests are completely normal, you will receive your results only by: MyChart Message (if you have MyChart) OR A paper copy in the mail If you have any lab test that is abnormal or we need to change your treatment, we will call you to review the results.   Testing/Procedures: Your physician has requested that you have an echocardiogram. Echocardiography is a painless test that uses sound waves to create images of your heart. It provides your doctor with information about the size and shape of your heart and how well your heart's chambers and valves are working. This procedure takes approximately one hour. There are no restrictions for this procedure. Please do NOT wear cologne, perfume, aftershave, or lotions (deodorant is allowed). Please arrive 15 minutes prior to your appointment time.    Follow-Up: At [facility], you and your health needs are our priority.  As part of our continuing mission to provide you with exceptional heart care, we have created designated Provider Care Teams.  These Care Teams include your primary Cardiologist (physician) and Advanced Practice Providers (APPs -  Physician Assistants and Nurse Practitioners) who all work together to provide you with the care you need, when you need it.  We recommend signing up for the patient portal called "MyChart".  Sign up information is provided on this After Visit Summary.  MyChart is used to connect with patients for Virtual Visits (Telemedicine).  Patients are able to view lab/test results, encounter notes, upcoming appointments, etc.  Non-urgent messages can be sent to  your provider as well.   To learn more about what you can do with MyChart, go to [email or web address].    Your next appointment:   6 month(s)  The format for your next appointment:   In Person  Provider:   Loman Brooklyn, MD    Thank you for choosing [facility]!!   Dory Horn, RN [phone]

## 2023-01-19 NOTE — Progress Notes (Signed)
Electrophysiology Office Note   Date:  01/19/2023   ID:  Steven Cannon, DOB February 04, 1956, MRN [id number]  PCP:  [facility]  Cardiologist:  Clifton James Primary Electrophysiologist:  Steven Zwart Jorja Loa, MD    Chief Complaint: PVC   History of Present Illness: Steven Cannon is a 67 y.o. male who is being seen today for the evaluation of PVC at the request of [facility] Presenting today for electrophysiology evaluation.  He has a history significant for acute promyelocytic leukemia, hypertension, hyperlipidemia, coronary artery disease.  He is post DES to the OM in 2015 with balloon angioplasty and repeat stenting in 2023.  He presented to cardiology clinic with concerns of low heart rates.  He was noted to have heart rates in the 30s.  When he walked his heart rates did get into the 60s.  He wore a cardiac monitor with a 16% PVC burden.  Today, denies symptoms of palpitations, chest pain, shortness of breath, orthopnea, PND, lower extremity edema, claudication, dizziness, presyncope, syncope, bleeding, or neurologic sequela. The patient is tolerating medications without difficulties.  He overall feels well.  He has no chest pain or shortness of breath.  He is able to all of his daily activities without restriction.  He has been walking up and down hills.  Since his most recent stent, he is felt much improved.  He is unaware of PVCs.   Past Medical History:  Diagnosis Date   Diabetes mellitus without complication (HCC)    GERD (gastroesophageal reflux disease)    Hyperlipidemia    Hypertension    Leukemia, acute (HCC)    Obesity    Past Surgical History:  Procedure Laterality Date   ADENOIDECTOMY     CORONARY ANGIOPLASTY WITH STENT PLACEMENT       Current Outpatient Medications  Medication Sig Dispense Refill   aspirin EC 81 MG tablet Take 81 mg by mouth daily.     clopidogrel (PLAVIX) 75 MG tablet Take 1 tablet (75 mg total) by mouth daily. 90  tablet 3   dapagliflozin propanediol (FARXIGA) 10 MG TABS tablet Take 1 tablet by mouth daily.     esomeprazole (NEXIUM) 20 MG capsule Take 20 mg by mouth daily.     glipiZIDE (GLUCOTROL XL) 5 MG 24 hr tablet Take 5 mg by mouth daily with breakfast.     lisinopril (ZESTRIL) 20 MG tablet Take 20 mg by mouth daily.     metoprolol succinate (TOPROL-XL) 100 MG 24 hr tablet Take 1 tablet (100 mg total) by mouth daily. Take with or immediately following a meal. 90 tablet 3   pioglitazone-metformin (ACTOPLUS MET) 15-850 MG tablet Take 1 tablet by mouth in the morning and at bedtime.     pravastatin (PRAVACHOL) 40 MG tablet Take 40 mg by mouth daily.     pravastatin (PRAVACHOL) 80 MG tablet Take 1 tablet by mouth daily.     No current facility-administered medications for this visit.    Allergies:   Iodine and Ivp dye [iodinated contrast media]   Social History:  The patient  reports that he has quit smoking. His smoking use included cigarettes. He has a 15.00 pack-year smoking history. He has never used smokeless tobacco. He reports that he does not currently use alcohol. He reports that he does not currently use drugs.   Family History:  The patient's family history includes Heart attack in his father; Heart disease in his mother.   ROS:  Please see  the history of present illness.   Otherwise, review of systems is positive for none.   All other systems are reviewed and negative.   PHYSICAL EXAM: VS:  BP 130/82   Pulse 70   Ht 6' (1.829 m)   Wt 272 lb (123.4 kg)   SpO2 96%   BMI 36.89 kg/m  , BMI Body mass index is 36.89 kg/m. GEN: Well nourished, well developed, in no acute distress  HEENT: normal  Neck: no JVD, carotid bruits, or masses Cardiac: RRR; no murmurs, rubs, or gallops,no edema  Respiratory:  clear to auscultation bilaterally, normal work of breathing GI: soft, nontender, nondistended, + BS MS: no deformity or atrophy  Skin: warm and dry Neuro:  Strength and sensation are  intact Psych: euthymic mood, full affect  EKG:  EKG is ordered today. Personal review of the ekg ordered shows this rhythm, ventricular bigeminy  Recent Labs: No results found for requested labs within last 365 days.    Lipid Panel  No results found for: "CHOL", "TRIG", "HDL", "CHOLHDL", "VLDL", "LDLCALC", "LDLDIRECT"   Wt Readings from Last 3 Encounters:  01/19/23 272 lb (123.4 kg)  07/14/22 265 lb 3.2 oz (120.3 kg)  05/10/22 256 lb (116.1 kg)      Other studies Reviewed: Additional studies/ records that were reviewed today include: TTE 03/15/22  Review of the above records today demonstrates:   1. Left ventricular ejection fraction, by estimation, is 65 to 70%. The  left ventricle has normal function. The left ventricle has no regional  wall motion abnormalities. The left ventricular internal cavity size was  mildly dilated. Left ventricular  diastolic parameters were normal.   2. Right ventricular systolic function is normal. The right ventricular  size is normal.   3. Left atrial size was mildly dilated.   4. The mitral valve is normal in structure. Trivial mitral valve  regurgitation.   5. The aortic valve is normal in structure. Aortic valve regurgitation is  not visualized.   6. The inferior vena cava is normal in size with greater than 50%  respiratory variability, suggesting right atrial pressure of 3 mmHg.   Cardiac monitor 06/10/2022 personally reviewed Sinus rhythm. (min HR of 46 bpm, max HR of 95 bpm, and avg HR of 64 bpm).  Rare premature atrial contractions. (<1.0%).  Frequent premature ventricular contractions. (16.0%, 91232), VE Couplets were rare (<1.0%, 9), and VE Triplets were rare (<1.0%, 1). Ventricular Bigeminy and Trigeminy were present.   ASSESSMENT AND PLAN:  1.  PVCs: 16% burden on cardiac monitor.  Patient asymptomatic and unaware of his PVCs.  He is able to do all his daily activities.  [name] plan for repeat echo to ensure his ejection fraction  has remained normal.  He would likely need yearly or biyearly echoes in the future if he elects to hold off on PVC suppression.  2.  Coronary artery disease: Status post DES to the OM with repeat balloon angioplasty April 2023.  No current chest pain.  Plan per primary cardiology.  3.  Hypertension: well controlled  4.  Hyperlipidemia: Continue statin per primary cardiology   Current medicines are reviewed at length with the patient today.   The patient does not have concerns regarding his medicines.  The following changes were made today:  none  Labs/ tests ordered today include:  Orders Placed This Encounter  Procedures   EKG 12-Lead   ECHOCARDIOGRAM COMPLETE     Disposition:   FU 6 months  Signed, Anderson Coppock  Jorja Loa, MD  01/19/2023 11:39 AM     [facility] [address] [place] [address] [phone] (office) [phone] (fax)

## 2023-02-22 ENCOUNTER — Ambulatory Visit (HOSPITAL_COMMUNITY): Payer: 59 | Attending: Cardiology

## 2023-02-22 DIAGNOSIS — I493 Ventricular premature depolarization: Secondary | ICD-10-CM | POA: Insufficient documentation

## 2023-02-22 DIAGNOSIS — I10 Essential (primary) hypertension: Secondary | ICD-10-CM

## 2023-02-22 LAB — ECHOCARDIOGRAM COMPLETE
Area-P 1/2: 4.02 cm2
S' Lateral: 3.6 cm

## 2023-03-01 ENCOUNTER — Telehealth: Payer: Self-pay | Admitting: Cardiovascular Disease

## 2023-03-01 NOTE — Telephone Encounter (Signed)
See echo result ?

## 2023-03-01 NOTE — Telephone Encounter (Signed)
Pt calling the nurse back regarding results . Please advise.

## 2023-03-27 ENCOUNTER — Other Ambulatory Visit: Payer: Self-pay | Admitting: Cardiovascular Disease

## 2023-03-27 DIAGNOSIS — E7800 Pure hypercholesterolemia, unspecified: Secondary | ICD-10-CM

## 2023-03-27 DIAGNOSIS — I10 Essential (primary) hypertension: Secondary | ICD-10-CM

## 2023-03-27 DIAGNOSIS — I2510 Atherosclerotic heart disease of native coronary artery without angina pectoris: Secondary | ICD-10-CM

## 2023-06-27 ENCOUNTER — Other Ambulatory Visit: Payer: Self-pay | Admitting: Cardiovascular Disease

## 2023-06-27 DIAGNOSIS — E7800 Pure hypercholesterolemia, unspecified: Secondary | ICD-10-CM

## 2023-06-27 DIAGNOSIS — I10 Essential (primary) hypertension: Secondary | ICD-10-CM

## 2023-06-27 DIAGNOSIS — I2510 Atherosclerotic heart disease of native coronary artery without angina pectoris: Secondary | ICD-10-CM

## 2023-06-27 MED ORDER — METOPROLOL SUCCINATE ER 100 MG PO TB24: 100.0000 mg | ORAL_TABLET | Freq: Every day | ORAL | 2 refills | Status: DC

## 2023-07-18 ENCOUNTER — Ambulatory Visit: Payer: 59 | Admitting: Cardiology

## 2023-08-02 NOTE — Progress Notes (Unsigned)
Electrophysiology Office Note:   Date:  08/03/2023  ID:  Steven Cannon, DOB 08-20-1956, MRN [id number]  Primary Cardiologist: Verne Carrow, MD Electrophysiologist: Regan Lemming, MD      History of Present Illness:   Steven Heman. Boquet is a 67 y.o. male with h/o PVC's, HTN, HLD, CAD s/p DES to OM 2015 with subsequent in-stent thrombosis 11/2021 s/p balloon angioplasty / repeat stenting to DES & acute promyelocytic leukemia seen today for routine electrophysiology followup.   Since last being seen in our clinic the patient reports he has been doing well. He retired but is working some as needed with [employer], continues to travel for work.  He denies any evidence of PVC's > syncope, pre-syncope, dizziness, lightheadedness.  Continues to take his Toprol XL as prescribed.  Reports he walks 1/2 mile to 1 mile daily / 25 minutes per day.   He denies chest pain, palpitations, dyspnea, PND, orthopnea, nausea, vomiting, dizziness, syncope, edema, weight gain, or early satiety.   Review of systems complete and found to be negative unless listed in HPI.   EP Information / Studies Reviewed:    EKG is ordered today. Personal review as below.  EKG Interpretation Date/Time:  Wednesday August 03 2023 11:02:30 EST Ventricular Rate:  67 PR Interval:  182 QRS Duration:  104 QT Interval:  378 QTC Calculation: 399 R Axis:   0  Text Interpretation: Normal sinus rhythm Confirmed by Canary Brim ([id number]) on 08/03/2023 11:08:46 AM   Studies:  LHC 01/2022 ([place]) > stenting of OM-1 to LCx ECHO 02/2022 > LVEF 65-70%, no RWMA, LA mildly dilated, trivial mitral regurgitation Cardiac Monitor 05/2022 > SR min 46-max of 95 bpm, ave 64 bpm.  Frequent PVC's 16% ECHO 01/2023 > LVEF 55-60%, no RWMA, G1DD   Arrhythmia / AAD PVC's > identified 2023, changed from atenolol to metoprolol    Risk Assessment/Calculations:     HYPERTENSION CONTROL Vitals:   08/03/23 1054 08/03/23 1136   BP: (!) 146/74 (!) 140/76    The patient's blood pressure is elevated above target today.  In order to address the patient's elevated BP: Blood pressure will be monitored at home to determine if medication changes need to be made.           Physical Exam:   VS:  BP (!) 140/76   Pulse 67   Ht 6' (1.829 m)   Wt 276 lb 9.6 oz (125.5 kg)   SpO2 93%   BMI 37.51 kg/m    Wt Readings from Last 3 Encounters:  08/03/23 276 lb 9.6 oz (125.5 kg)  01/19/23 272 lb (123.4 kg)  07/14/22 265 lb 3.2 oz (120.3 kg)     GEN: Well nourished, well developed in no acute distress NECK: No JVD; No carotid bruits CARDIAC: Regular rate and rhythm, no murmurs, rubs, gallops RESPIRATORY:  Clear to auscultation without rales, wheezing or rhonchi  ABDOMEN: Soft, non-tender, non-distended EXTREMITIES:  No edema; No deformity   ASSESSMENT AND PLAN:    PVC's  Asymptomatic. Prior monitor with 16% burden. Changed from atenolol > metoprolol. ECHO 01/2023 with nml LVEF  -continue metoprolol 100mg  daily -remains asymptomatic  -no known PVC's, EKG NSR   Hypertension  -BP elevated in clinic > pt notes he has been eating a lot of country ham in the last few days.  Reviewed relationship of salt / water and BP.  Discussed cutting back on Na+ intake, exercise.  -asked patient to keep record of BP at home  this week and to notify PCP if PB remains elevated   CAD  HLD S/p DES to OM, with repeat balloon angioplasty 11/2021  -no anginal symptoms  -follows with Cardiology   Follow up with Dr. Elberta Fortis in 6 months  Signed, Canary Brim, MSN, APRN, NP-C, AGACNP-BC [facility] - Electrophysiology  08/03/2023, 11:43 AM

## 2023-08-03 ENCOUNTER — Ambulatory Visit: Payer: Medicare Other | Attending: Pulmonary Disease | Admitting: Pulmonary Disease

## 2023-08-03 ENCOUNTER — Encounter: Payer: Self-pay | Admitting: Pulmonary Disease

## 2023-08-03 VITALS — BP 140/76 | HR 67 | Ht 72.0 in | Wt 276.6 lb

## 2023-08-03 DIAGNOSIS — I10 Essential (primary) hypertension: Secondary | ICD-10-CM

## 2023-08-03 DIAGNOSIS — I493 Ventricular premature depolarization: Secondary | ICD-10-CM

## 2023-08-03 DIAGNOSIS — I2510 Atherosclerotic heart disease of native coronary artery without angina pectoris: Secondary | ICD-10-CM

## 2023-08-03 DIAGNOSIS — E785 Hyperlipidemia, unspecified: Secondary | ICD-10-CM | POA: Diagnosis not present

## 2023-08-03 NOTE — Patient Instructions (Signed)
Medication Instructions:  Your physician recommends that you continue on your current medications as directed. Please refer to the Current Medication list given to you today.  *If you need a refill on your cardiac medications before your next appointment, please call your pharmacy*  Lab Work: None ordered If you have labs (blood work) drawn today and your tests are completely normal, you will receive your results only by: MyChart Message (if you have MyChart) OR A paper copy in the mail If you have any lab test that is abnormal or we need to change your treatment, we will call you to review the results.  Follow-Up: At [facility], you and your health needs are our priority.  As part of our continuing mission to provide you with exceptional heart care, we have created designated Provider Care Teams.  These Care Teams include your primary Cardiologist (physician) and Advanced Practice Providers (APPs -  Physician Assistants and Nurse Practitioners) who all work together to provide you with the care you need, when you need it.  Your next appointment:   6 month(s)  Provider:   Loman Brooklyn, MD    Other Instructions Avoid country ham Keep a log of your blood pressure readings and send it to your primary care provider

## 2024-01-31 IMAGING — CR DG CHEST 2V
2 series · 2 of 2 positions shown · non-contrast
Comparison: 09/13/2013

CLINICAL DATA: Chest pain

EXAM:
CHEST - 2 VIEW

[w chest pa]
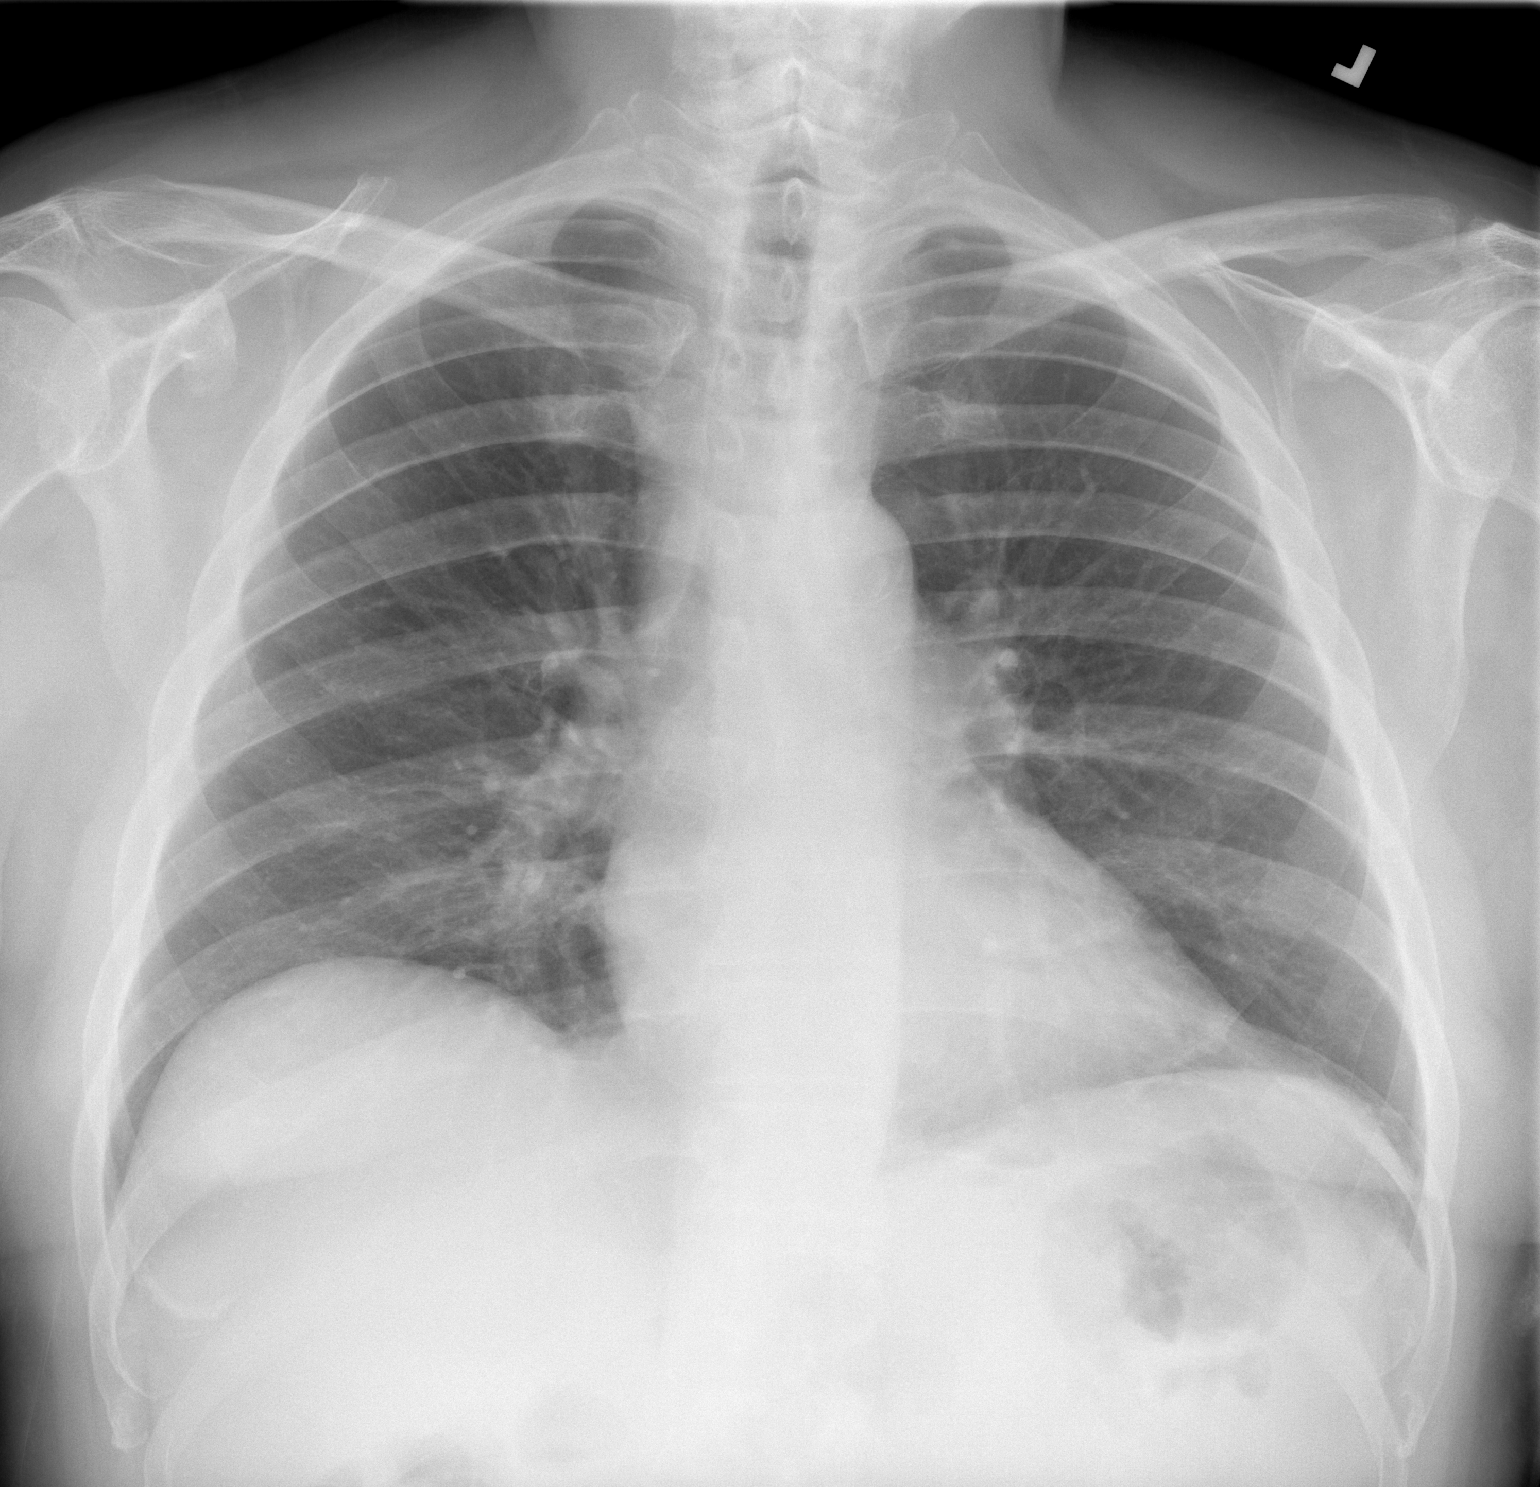

[w chest lat]
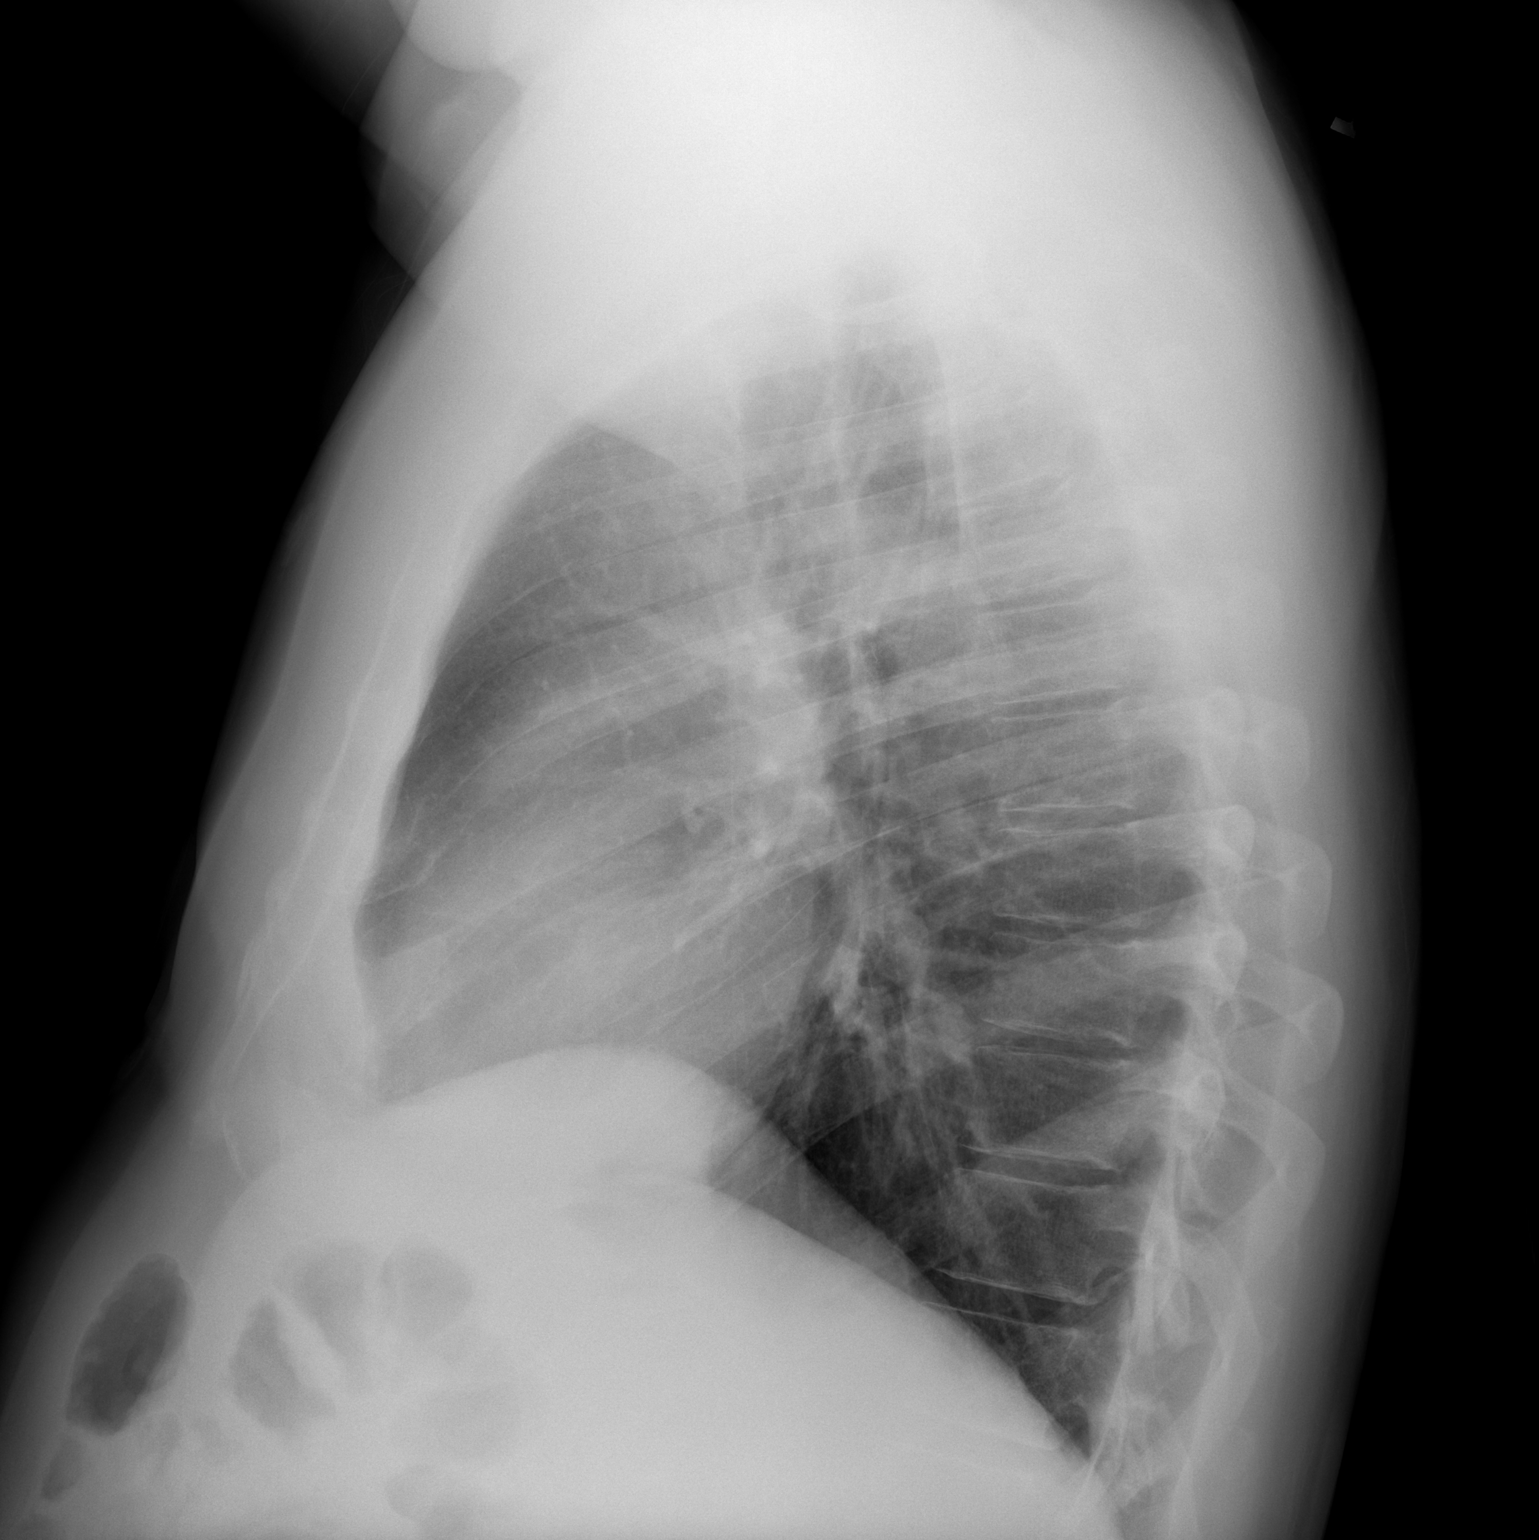

[2 of 2 positions shown; findings below may reference images not displayed]

FINDINGS: The heart size and mediastinal contours are within normal limits.
Both lungs are clear. The visualized skeletal structures are
unremarkable.
IMPRESSION: No active cardiopulmonary disease.

## 2024-02-01 ENCOUNTER — Ambulatory Visit
Admission: EM | Admit: 2024-02-01 | Discharge: 2024-02-01 | Disposition: A | Discharge status: Home / Self Care | Attending: Physician Assistant | Admitting: Physician Assistant

## 2024-02-01 ENCOUNTER — Other Ambulatory Visit: Payer: Self-pay

## 2024-02-01 DIAGNOSIS — L03032 Cellulitis of left toe: Secondary | ICD-10-CM | POA: Diagnosis not present

## 2024-02-01 MED ORDER — SULFAMETHOXAZOLE-TRIMETHOPRIM 800-160 MG PO TABS: 1.0000 | ORAL_TABLET | Freq: Two times a day (BID) | ORAL | 0 refills | Status: AC

## 2024-02-01 NOTE — Discharge Instructions (Addendum)
 You were seen today for concerns of redness and swelling along your left second toe.  At this time I suspect that you likely had what is called a paronychia or a collection of pus along the side of the nail and I think that the red area of skin is likely a skin infection called cellulitis. I performed an incision and drainage today to help with the paronychia.  This may continue to drain over the next few days but this is normal.  You can continue to do Epsom salt soaks and apply antibiotic ointment per your preference. To help with the skin infection I have sent in a medication called Bactrim.  This is an antibiotic and should be taken twice per day for 7 days.  Please make sure that you take this with food as antibiotics can be hard on the stomach. Please keep an eye out for increased swelling, redness, copious amounts of drainage, inability to move or bend your toe, severe pain as these could be signs of a worsening infection.  Should this happen you can return to urgent care or follow-up in the emergency room for more severe symptoms.

## 2024-02-01 NOTE — ED Triage Notes (Addendum)
 Pt presents with complaints of fall with toe injury (left side, second toe) two days ago. Pt states he was outside and cleaning on Monday 6/2. The ground was slippery, pt fell and attempted catch self on fence. Toe was caught under the fence, "may have wood in big toe on left side, I am not sure." Pt currently rates his toe pain a 1/10. Neosporin + epsom salt at home. Denies head injury.

## 2024-02-01 NOTE — ED Provider Notes (Signed)
 [facility]    CSN: [id number] Arrival date & time: 02/01/24  4098      History   Chief Complaint Chief Complaint  Patient presents with   Fall   Toe Injury    HPI Steven Cannon is a 68 y.o. male.   HPI  Pt reports that he fell on 01/30/24 and hit his toe/ foot against a fence as he fell He denies hitting head or LOC He reports concerns for second left toe having redness and pain  He is not sure if there is foreign body present    Past Medical History:  Diagnosis Date   Diabetes mellitus without complication (HCC)    GERD (gastroesophageal reflux disease)    Hyperlipidemia    Hypertension    Leukemia, acute (HCC)    Obesity     There are no active problems to display for this patient.   Past Surgical History:  Procedure Laterality Date   ADENOIDECTOMY     CORONARY ANGIOPLASTY WITH STENT PLACEMENT         Home Medications    Prior to Admission medications   Medication Sig Start Date End Date Taking? Authorizing Provider  sulfamethoxazole-trimethoprim (BACTRIM DS) 800-160 MG tablet Take 1 tablet by mouth 2 (two) times daily for 7 days. 02/01/24 02/08/24 Yes Taher Vannote E, PA-C  aspirin EC 81 MG tablet Take 81 mg by mouth daily.    [provider]  clopidogrel  (PLAVIX ) 75 MG tablet TAKE 1 TABLET(75 MG) BY MOUTH DAILY 06/27/23   Odie Benne, MD  dapagliflozin propanediol (FARXIGA) 10 MG TABS tablet Take 1 tablet by mouth daily. 11/30/21   [provider]  esomeprazole (NEXIUM) 20 MG capsule Take 20 mg by mouth daily. 05/07/22   [provider]  glipiZIDE (GLUCOTROL XL) 5 MG 24 hr tablet Take 5 mg by mouth daily with breakfast.    [provider]  lisinopril (ZESTRIL) 20 MG tablet Take 20 mg by mouth daily.    [provider]  metoprolol  succinate (TOPROL -XL) 100 MG 24 hr tablet Take 1 tablet (100 mg total) by mouth daily. Take with or immediately following a meal. 06/27/23   Odie Benne, MD  pioglitazone-metformin (ACTOPLUS MET) 15-850 MG tablet Take 1 tablet by mouth in the morning and at bedtime. 02/06/22   [provider]  pravastatin (PRAVACHOL) 40 MG tablet Take 40 mg by mouth daily. Patient not taking: Reported on 08/03/2023    [provider]  pravastatin (PRAVACHOL) 80 MG tablet Take 1 tablet by mouth daily. 05/17/22   [provider]    Family History Family History  Problem Relation Age of Onset   Heart disease Mother    Heart attack Father     Social History Social History   Tobacco Use   Smoking status: Former    Current packs/day: 1.00    Average packs/day: 1 pack/day for 15.0 years (15.0 ttl pk-yrs)    Types: Cigarettes   Smokeless tobacco: Never  Vaping Use   Vaping status: Never Used  Substance Use Topics   Alcohol use: Not Currently   Drug use: Not Currently     Allergies   Iodine and Ivp dye [iodinated contrast media]   Review of Systems Review of Systems  Skin:  Positive for color change and wound.     Physical Exam Triage Vital Signs ED Triage Vitals  Encounter Vitals Group     BP 02/01/24 0943 (!) 170/78  Systolic BP Percentile --      Diastolic BP Percentile --      Pulse Rate 02/01/24 0943 63     Resp 02/01/24 0943 20     Temp 02/01/24 0943 97.8 F (36.6 C)     Temp Source 02/01/24 0943 Oral     SpO2 02/01/24 0943 97 %     Weight 02/01/24 0943 260 lb (117.9 kg)     Height 02/01/24 0943 6' (1.829 m)     Head Circumference --      Peak Flow --      Pain Score 02/01/24 0951 1     Pain Loc --      Pain Education --      Exclude from Growth Chart --    No data found.  Updated Vital Signs BP (!) 170/78 (BP Location: Right Arm)   Pulse 63   Temp 97.8 F (36.6 C) (Oral)   Resp 20   Ht 6' (1.829 m)   Wt 260 lb (117.9 kg)   SpO2 97%   BMI 35.26 kg/m   Visual Acuity Right Eye Distance:   Left Eye Distance:   Bilateral Distance:    Right Eye Near:   Left Eye Near:    Bilateral  Near:     Physical Exam Vitals reviewed.  Constitutional:      General: He is awake.     Appearance: Normal appearance. He is well-developed and well-groomed.  HENT:     Head: Normocephalic and atraumatic.  Eyes:     Extraocular Movements: Extraocular movements intact.     Conjunctiva/sclera: Conjunctivae normal.  Pulmonary:     Effort: Pulmonary effort is normal.  Musculoskeletal:     Cervical back: Normal range of motion.       Feet:  Feet:     Left foot:     Skin integrity: Erythema and warmth present.     Toenail Condition: Left toenails are normal.  Neurological:     General: No focal deficit present.     Mental Status: He is alert and oriented to person, place, and time.     GCS: GCS eye subscore is 4. GCS verbal subscore is 5. GCS motor subscore is 6.  Psychiatric:        Attention and Perception: Attention normal.        Mood and Affect: Mood normal.        Speech: Speech normal.        Behavior: Behavior normal. Behavior is cooperative.      UC Treatments / Results  Labs (all labs ordered are listed, but only abnormal results are displayed) Labs Reviewed - No data to display  EKG   Radiology No results found.  Procedures Incision and Drainage  Date/Time: 02/01/2024 10:04 AM  Performed by: Jerona Mooring, PA-C Authorized by: Jerona Mooring, PA-C   Consent:    Consent obtained:  Verbal   Consent given by:  Patient   Risks, benefits, and alternatives were discussed: yes     Risks discussed:  Bleeding, incomplete drainage, pain and infection   Alternatives discussed:  No treatment, delayed treatment, alternative treatment and observation Universal protocol:    Procedure explained and questions answered to patient or proxy's satisfaction: yes     Patient identity confirmed:  Verbally with patient Location:    Type:  Fluid collection   Size:  Approx 1 cm   Location:  Lower extremity   Lower extremity location:  Toe  Toe location:  L second  toe Pre-procedure details:    Skin preparation:  Chlorhexidine with alcohol Sedation:    Sedation type:  None Anesthesia:    Anesthesia method:  None Procedure type:    Complexity:  Simple Procedure details:    Needle aspiration: yes     Needle size:  18 G   Incision types:  Stab incision   Incision depth:  Dermal   Drainage:  Purulent and bloody   Drainage amount:  Scant   Wound treatment:  Wound left open   Packing materials:  None Post-procedure details:    Procedure completion:  Tolerated  (including critical care time)  Medications Ordered in UC Medications - No data to display  Initial Impression / Assessment and Plan / UC Course  I have reviewed the triage vital signs and the nursing notes.  Pertinent labs & imaging results that were available during my care of the patient were reviewed by me and considered in my medical decision making (see chart for details).      Final Clinical Impressions(s) / UC Diagnoses   Final diagnoses:  Paronychia of second toe of left foot  Cellulitis of toe of left foot   Patient presents today with concerns of redness and swelling along the left second toe.  Physical exam appears consistent with likely paronychia as well as potential cellulitis.  Incision and drainage was performed on paronychia with purulent drainage and pus expressed.  To assist with cellulitis will send Bactrim p.o. twice daily x 7 days.  Recommend soaking the foot with warm water and Epsom salt to encourage further drainage and healing.  Home care measures reviewed and provided in after visit summary.  ED and return precautions reviewed and provided in after visit summary.  Follow-up as needed.    Discharge Instructions      You were seen today for concerns of redness and swelling along your left second toe.  At this time I suspect that you likely had what is called a paronychia or a collection of pus along the side of the nail and I think that the red area of  skin is likely a skin infection called cellulitis. I performed an incision and drainage today to help with the paronychia.  This may continue to drain over the next few days but this is normal.  You can continue to do Epsom salt soaks and apply antibiotic ointment per your preference. To help with the skin infection I have sent in a medication called Bactrim.  This is an antibiotic and should be taken twice per day for 7 days.  Please make sure that you take this with food as antibiotics can be hard on the stomach. Please keep an eye out for increased swelling, redness, copious amounts of drainage, inability to move or bend your toe, severe pain as these could be signs of a worsening infection.  Should this happen you can return to urgent care or follow-up in the emergency room for more severe symptoms.   ED Prescriptions     Medication Sig Dispense Auth. Provider   sulfamethoxazole-trimethoprim (BACTRIM DS) 800-160 MG tablet Take 1 tablet by mouth 2 (two) times daily for 7 days. 14 tablet Jalina Blowers E, PA-C      PDMP not reviewed this encounter.   Jerona Mooring, PA-C 02/02/24 1610

## 2024-04-12 ENCOUNTER — Other Ambulatory Visit: Payer: Self-pay | Admitting: Cardiovascular Disease

## 2024-04-12 DIAGNOSIS — E7800 Pure hypercholesterolemia, unspecified: Secondary | ICD-10-CM

## 2024-04-12 DIAGNOSIS — I10 Essential (primary) hypertension: Secondary | ICD-10-CM

## 2024-04-12 DIAGNOSIS — I2510 Atherosclerotic heart disease of native coronary artery without angina pectoris: Secondary | ICD-10-CM

## 2024-04-12 MED ORDER — METOPROLOL SUCCINATE ER 100 MG PO TB24: 100.0000 mg | ORAL_TABLET | Freq: Every day | ORAL | 1 refills | Status: DC

## 2024-07-12 ENCOUNTER — Other Ambulatory Visit: Payer: Self-pay | Admitting: Cardiovascular Disease

## 2024-07-12 DIAGNOSIS — I2510 Atherosclerotic heart disease of native coronary artery without angina pectoris: Secondary | ICD-10-CM

## 2024-07-12 DIAGNOSIS — I10 Essential (primary) hypertension: Secondary | ICD-10-CM

## 2024-07-12 DIAGNOSIS — E7800 Pure hypercholesterolemia, unspecified: Secondary | ICD-10-CM

## 2024-07-17 ENCOUNTER — Telehealth: Payer: Self-pay | Admitting: Cardiovascular Disease

## 2024-07-17 ENCOUNTER — Other Ambulatory Visit: Payer: Self-pay | Admitting: Cardiovascular Disease

## 2024-07-17 DIAGNOSIS — E7800 Pure hypercholesterolemia, unspecified: Secondary | ICD-10-CM

## 2024-07-17 DIAGNOSIS — I2510 Atherosclerotic heart disease of native coronary artery without angina pectoris: Secondary | ICD-10-CM

## 2024-07-17 DIAGNOSIS — I10 Essential (primary) hypertension: Secondary | ICD-10-CM

## 2024-07-17 NOTE — Telephone Encounter (Signed)
*  STAT* If patient is at the pharmacy, call can be transferred to refill team.   1. Which medications need to be refilled? (please list name of each medication and dose if known)  metoprolol  succinate (TOPROL -XL) 100 MG 24 hr tablet   clopidogrel  (PLAVIX ) 75 MG tablet   2. Which pharmacy/location (including street and city if local pharmacy) is medication to be sent to? [id number] - [place], [place] - [address]   3. Do they need a 30 day or 90 day supply?

## 2024-07-17 NOTE — Telephone Encounter (Signed)
 Spoke with [employer].  They couldn't tell me why the refills have been cancelled, but she came back and said that they will have them ready before 5.  I did call the pt back and make him aware.  He was very appreciative of the assistance.

## 2024-07-17 NOTE — Telephone Encounter (Signed)
 Spoke with pt.  Per pt, [facility] told him that we cancelled the refill of Plavix  and Metoprolol .    I called [facility], they are on lunch break until 2.  Will try after 2.

## 2024-08-28 ENCOUNTER — Ambulatory Visit: Attending: Student | Admitting: Student

## 2024-08-28 ENCOUNTER — Encounter: Payer: Self-pay | Admitting: Student

## 2024-08-28 VITALS — BP 150/84 | HR 56 | Ht 69.0 in | Wt 260.0 lb

## 2024-08-28 DIAGNOSIS — I493 Ventricular premature depolarization: Secondary | ICD-10-CM | POA: Diagnosis not present

## 2024-08-28 DIAGNOSIS — I2510 Atherosclerotic heart disease of native coronary artery without angina pectoris: Secondary | ICD-10-CM | POA: Diagnosis not present

## 2024-08-28 DIAGNOSIS — I10 Essential (primary) hypertension: Secondary | ICD-10-CM | POA: Insufficient documentation

## 2024-08-28 DIAGNOSIS — E7800 Pure hypercholesterolemia, unspecified: Secondary | ICD-10-CM | POA: Diagnosis not present

## 2024-08-28 NOTE — Progress Notes (Signed)
" °  Electrophysiology Office Note:   Date:  08/28/2024  ID:  Steven Cannon, DOB February 04, 1956, MRN [id number]  Primary Cardiologist: Lonni Cash, MD Electrophysiologist: Soyla Gladis Norton, MD   Electrophysiologist:  Soyla Gladis Norton, MD      History of Present Illness:   Steven Cannon is a 68 y.o. 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   Since last being seen in our clinic the patient reports doing very well. Overall, he denies chest pain, palpitations, dyspnea, PND, orthopnea, nausea, vomiting, dizziness, syncope, edema, weight gain, or early satiety.   Review of systems complete and found to be negative unless listed in HPI.   EP Information / Studies Reviewed:    EKG is ordered today. Personal review as below.  EKG Interpretation Date/Time:  Tuesday August 28 2024 11:37:56 EST Ventricular Rate:  56 PR Interval:  168 QRS Duration:  108 QT Interval:  416 QTC Calculation: 401 R Axis:   -21  Text Interpretation: Sinus bradycardia When compared with ECG of 03-Aug-2023 11:02, No significant change was found Confirmed by Lesia Sharper [phone]) on 08/28/2024 11:46:48 AM    Arrhythmia/Device History No specialty comments available.   Physical Exam:   VS:  BP (!) 150/84   Pulse (!) 56   Ht 5' 9 (1.753 m)   Wt 260 lb (117.9 kg)   SpO2 97%   BMI 38.40 kg/m    Wt Readings from Last 3 Encounters:  08/28/24 260 lb (117.9 kg)  02/01/24 260 lb (117.9 kg)  08/03/23 276 lb 9.6 oz (125.5 kg)     GEN: No acute distress NECK: No JVD; No carotid bruits CARDIAC: Regular rate and rhythm, no murmurs, rubs, gallops RESPIRATORY:  Clear to auscultation without rales, wheezing or rhonchi  ABDOMEN: Soft, non-tender, non-distended EXTREMITIES:  No edema; No deformity   ASSESSMENT AND  PLAN:    PVCs Asymptomatic EKG today shows sinus bradycardia with no PVCs Continue toprol  100 mg daily  HTN Stable on current regimen   CAD No s/s of ischemia.     He remains on plavix . Will schedule overdue follow up with [facility] to discuss if needs to continue given multiple stents in the past.  HLD Continue statin   Overdue for [facility] follow up given h/o multiple PCIs.   PVCs are well controlled on beta block and asymptomatic. Can re-establish with gen cards and see EP as needed only.   Signed, Sharper Prentice Lesia, PA-C  "

## 2024-08-28 NOTE — Patient Instructions (Signed)
 Medication Instructions:  No medication changes today. *If you need a refill on your cardiac medications before your next appointment, please call your pharmacy*  Lab Work: No labwork ordered today. If you have labs (blood work) drawn today and your tests are completely normal, you will receive your results only by: MyChart Message (if you have MyChart) OR A paper copy in the mail If you have any lab test that is abnormal or we need to change your treatment, we will call you to review the results.  Testing/Procedures: No testing ordered today  Follow-Up: At [facility], you and your health needs are our priority.  As part of our continuing mission to provide you with exceptional heart care, our providers are all part of one team.  This team includes your primary Cardiologist (physician) and Advanced Practice Providers or APPs (Physician Assistants and Nurse Practitioners) who all work together to provide you with the care you need, when you need it.  Your next appointment:   3-4 month(s) with General Cardiology  You may see Dr. Inocencio or the EP Team as needed.   Provider:   You may see Dr. Verlin or one of his APPS  We recommend signing up for the patient portal called MyChart.  Sign up information is provided on this After Visit Summary.  MyChart is used to connect with patients for Virtual Visits (Telemedicine).  Patients are able to view lab/test results, encounter notes, upcoming appointments, etc.  Non-urgent messages can be sent to your provider as well.   To learn more about what you can do with MyChart, go to [email or web address].

## 2024-11-05 ENCOUNTER — Ambulatory Visit: Admitting: Emergency Medicine
